# Patient Record
Sex: Female | Born: 1950 | Race: Black or African American | Hispanic: No
Health system: Southern US, Community
[De-identification: ages and names within clinical notes are randomized; demographics above are authoritative.]

---

## 2019-01-03 ENCOUNTER — Other Ambulatory Visit: Payer: Self-pay | Admitting: Physician Assistant

## 2019-01-03 DIAGNOSIS — Z1231 Encounter for screening mammogram for malignant neoplasm of breast: Secondary | ICD-10-CM

## 2019-02-06 ENCOUNTER — Other Ambulatory Visit: Payer: Self-pay | Admitting: Nephrology

## 2019-02-06 DIAGNOSIS — N184 Chronic kidney disease, stage 4 (severe): Secondary | ICD-10-CM

## 2019-02-12 ENCOUNTER — Ambulatory Visit
Admission: RE | Admit: 2019-02-12 | Discharge: 2019-02-12 | Disposition: A | Discharge status: Home / Self Care | Payer: Medicare Other | Source: Ambulatory Visit | Attending: Nephrology | Admitting: Nephrology

## 2019-02-12 DIAGNOSIS — N184 Chronic kidney disease, stage 4 (severe): Secondary | ICD-10-CM

## 2019-02-20 ENCOUNTER — Ambulatory Visit
Admission: RE | Admit: 2019-02-20 | Discharge: 2019-02-20 | Disposition: A | Discharge status: Home / Self Care | Payer: Medicare Other | Source: Ambulatory Visit | Attending: Physician Assistant | Admitting: Physician Assistant

## 2019-02-20 ENCOUNTER — Other Ambulatory Visit: Payer: Self-pay

## 2019-02-20 DIAGNOSIS — Z1231 Encounter for screening mammogram for malignant neoplasm of breast: Secondary | ICD-10-CM

## 2019-04-07 ENCOUNTER — Other Ambulatory Visit: Payer: Self-pay | Admitting: Physician Assistant

## 2019-04-07 ENCOUNTER — Other Ambulatory Visit (HOSPITAL_COMMUNITY): Payer: Self-pay | Admitting: Physician Assistant

## 2019-04-07 DIAGNOSIS — M5416 Radiculopathy, lumbar region: Secondary | ICD-10-CM

## 2019-04-21 ENCOUNTER — Encounter (HOSPITAL_COMMUNITY): Payer: Self-pay

## 2019-04-21 NOTE — Progress Notes (Signed)
Kelsey Lin,  Thanks. I had been trying the office. The radiologist recommended a CT myelogram due to the very limited images we could do with a MRI. We can only do a couple sequences and not a full exam due to the conditions of the stimulator. I let them know so they are going to do the CT instead for now. They will contact us if they decide they still want it after doing the CT myelogram.  Kelsey Lin RT (R ) (CT) (MR) ([place]) [place]   Radiology Services MRI Supervisor/MR Clinical Imaging Specialist Direct Dial: [phone] Website: [email or web address]   From: Kelsey Lin [email or web address]>  Sent: Tuesday, April 15, 2019 1:53 PM To: Kelsey Lin @[email or web address]> Subject: RE: PaceMaker  I thought I replied to this but I cannot find it, if I did. [facility] Secretary ext [phone] Other office number is [facility] [facility] Scheduler II (SW) NonNursing Sitter(SW)  [place] [phone]  From: Kelsey Lin [email or web address]>  Sent: Friday, April 11, 2019 1:50 PM To: Kelsey Lin @[email or web address]> Subject: RE: PaceMaker  Who from the office is trying to schedule this patient? I need to speak to them before we schedule this.  Kelsey Lin RT (R ) (CT) (MR) ([place]) [facility]   Radiology Services MRI Supervisor/MR Clinical Imaging Specialist Direct Dial: [phone] Website: [email or web address]   From: Kelsey Lin [email or web address]>  Sent: Friday, April 11, 2019 12:31 PM To: Kelsey Lin @ [email or web address]> Subject: PaceMaker  Kelsey Lin 17-Jan-1951 [id number] Patient brought her card in and it was scanned into epic   [place] Radiology Scheduler II (SW) NonNursing Sitter(SW)  [place] [phone]

## 2019-05-16 ENCOUNTER — Other Ambulatory Visit: Payer: Self-pay | Admitting: Physician Assistant

## 2019-05-16 DIAGNOSIS — M5416 Radiculopathy, lumbar region: Secondary | ICD-10-CM

## 2019-05-20 ENCOUNTER — Telehealth: Payer: Self-pay | Admitting: Nurse Practitioner

## 2019-05-20 NOTE — Telephone Encounter (Signed)
Phone call to patient to verify medication list and allergies for myelogram procedure. Pt aware she will not need to hold any medications for this procedure. Pre and post procedure instructions reviewed with pt. Pt verbalized understanding. 

## 2019-05-26 ENCOUNTER — Other Ambulatory Visit: Payer: Medicare Other

## 2019-06-15 ENCOUNTER — Ambulatory Visit: Payer: Medicare Other | Attending: Internal Medicine

## 2019-06-15 DIAGNOSIS — Z23 Encounter for immunization: Secondary | ICD-10-CM | POA: Insufficient documentation

## 2019-06-15 NOTE — Progress Notes (Signed)
   Covid-19 Vaccination Clinic  Name:  Syliva Rosson    MRN: [id number] DOB: December 08, 1950  06/15/2019  Ms. Jandt was observed post Covid-19 immunization for 15 minutes without incidence. She was provided with Vaccine Information Sheet and instruction to access the V-Safe system.   Ms. Stum was instructed to call 911 with any severe reactions post vaccine: Marland Kitchen Difficulty breathing  . Swelling of your face and throat  . A fast heartbeat  . A bad rash all over your body  . Dizziness and weakness    Immunizations Administered    Name Date Dose VIS Date Route   Pfizer COVID-19 Vaccine 06/15/2019  1:16 PM 0.3 mL 04/11/2019 Intramuscular   Manufacturer: [place]   Lot: [id number]   [place]: [id number]

## 2019-06-27 ENCOUNTER — Ambulatory Visit: Payer: Self-pay

## 2019-07-08 ENCOUNTER — Ambulatory Visit: Payer: Medicare Other | Attending: Internal Medicine

## 2019-07-08 DIAGNOSIS — Z23 Encounter for immunization: Secondary | ICD-10-CM

## 2019-07-08 NOTE — Progress Notes (Signed)
   Covid-19 Vaccination Clinic  Name:  Kelsey Lin    MRN: [id number] DOB: [birth year]  07/08/2019  [name] was observed post Covid-19 immunization for 15 minutes without incident. She was provided with Vaccine Information Sheet and instruction to access the V-Safe system.   [name] was instructed to call 911 with any severe reactions post vaccine: Marland Kitchen Difficulty breathing  . Swelling of face and throat  . A fast heartbeat  . A bad rash all over body  . Dizziness and weakness   Immunizations Administered    Name Date Dose VIS Date Route   Pfizer COVID-19 Vaccine 07/08/2019 10:19 AM 0.3 mL 04/11/2019 Intramuscular   Manufacturer: [place]   Lot: [id number]   [place]: [id number]

## 2019-07-09 ENCOUNTER — Ambulatory Visit: Payer: Medicare Other

## 2021-03-27 IMAGING — US US RENAL
1 series · 14 of 25 positions shown · non-contrast
Comparison: None

CLINICAL DATA: Chronic kidney disease

EXAM:
RENAL / URINARY TRACT ULTRASOUND COMPLETE

[Series 1: us renal · 0.19mm/px · 14 of 35 slices shown]
[im 1/35]
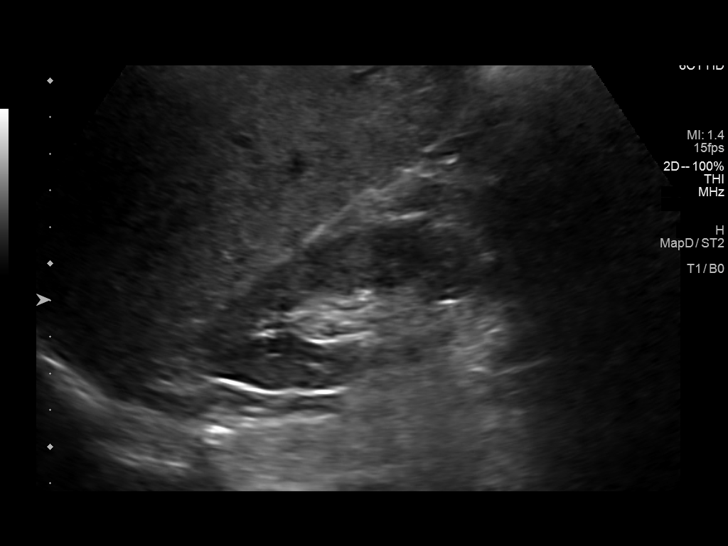
[im 3/35]
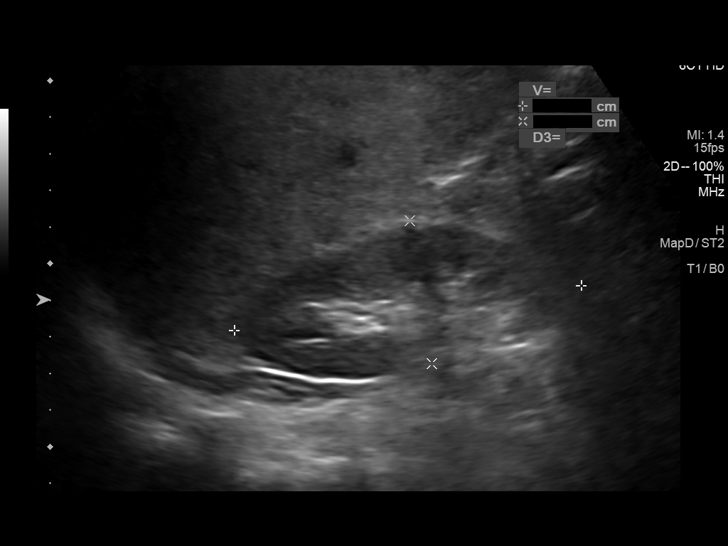
[im 6/35]
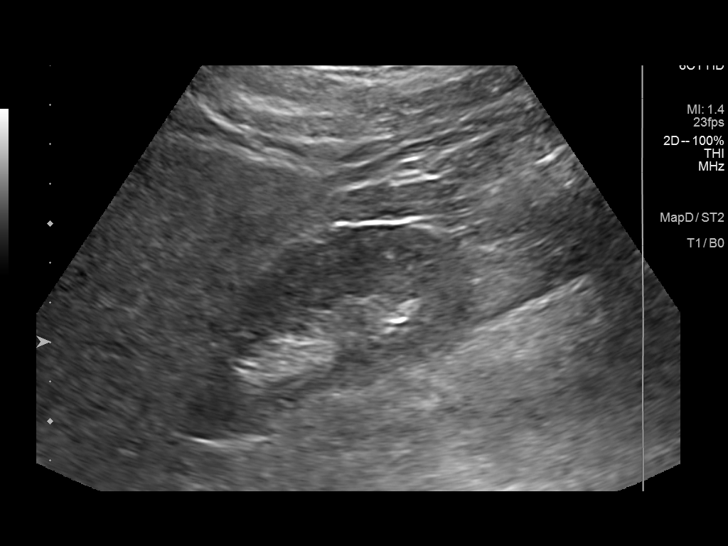
[im 9/35]
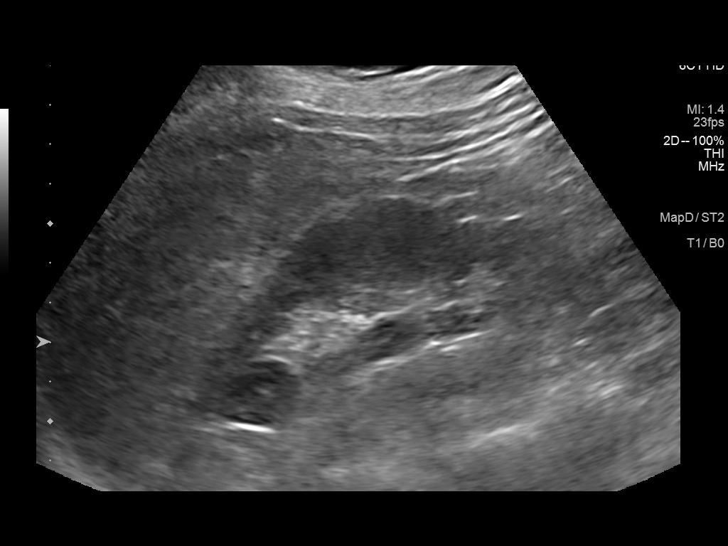
[im 12/35]
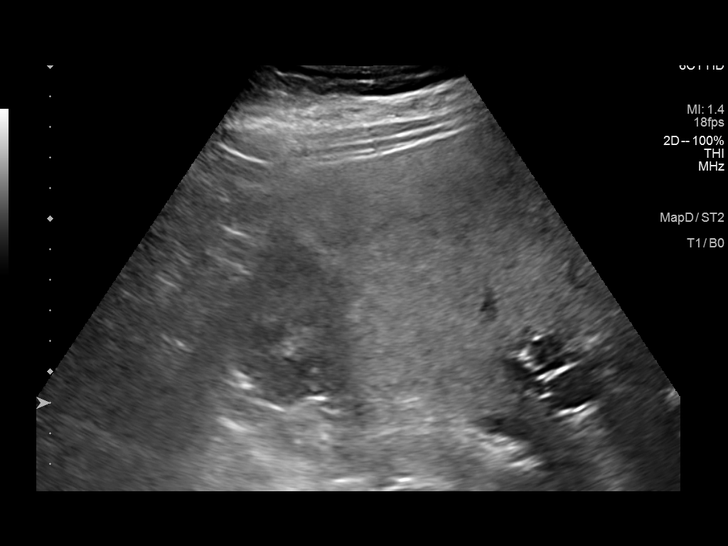
[im 13/35]
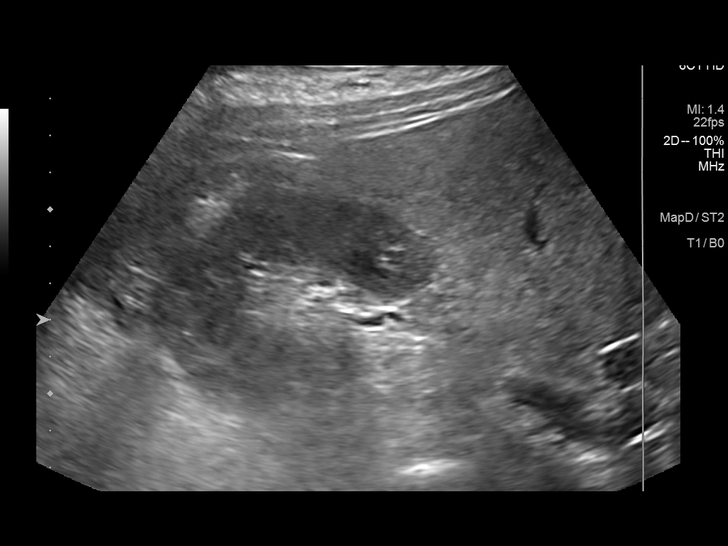
[im 16/35]
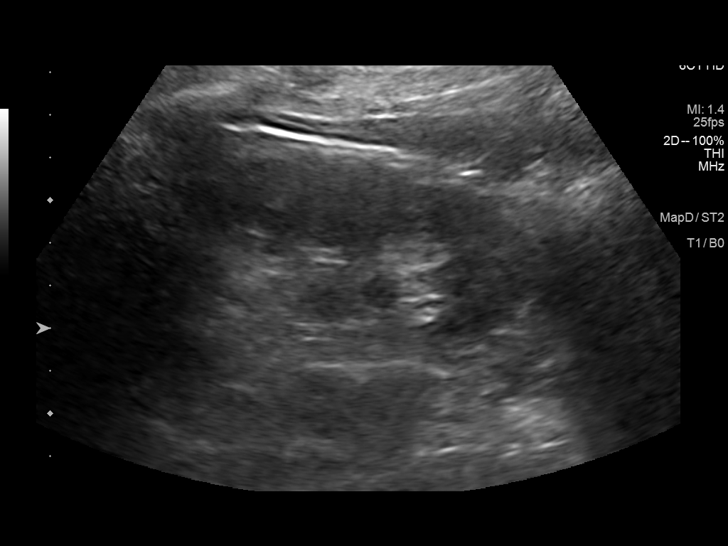
[im 19/35]
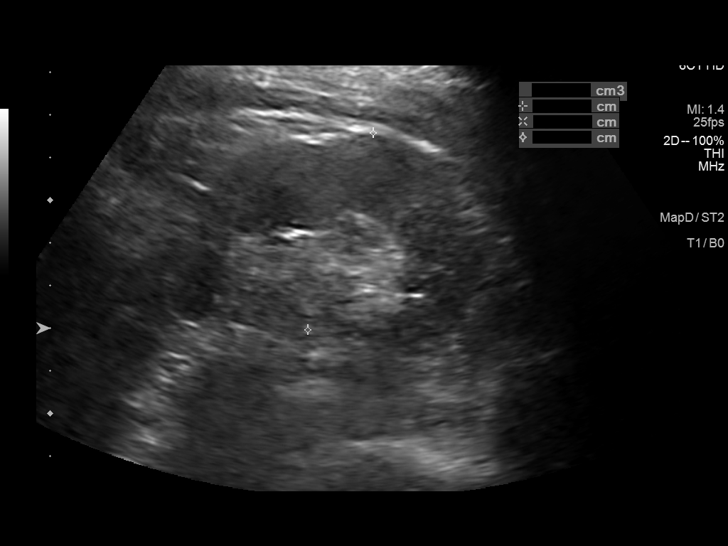
[im 22/35]
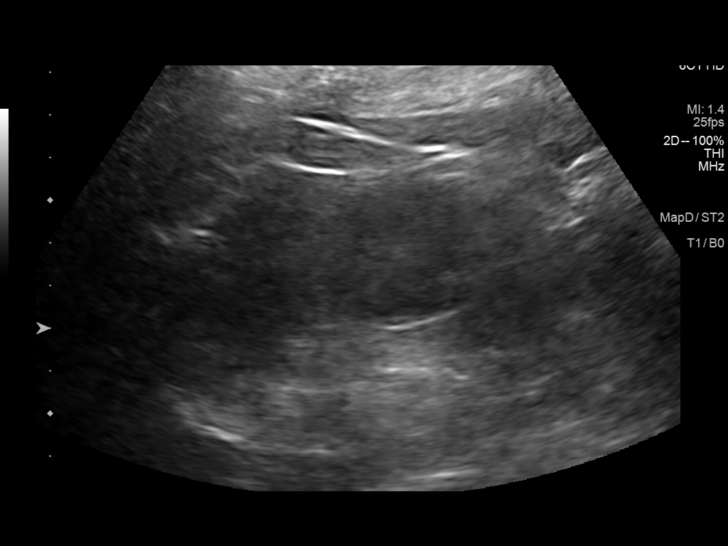
[im 23/35]
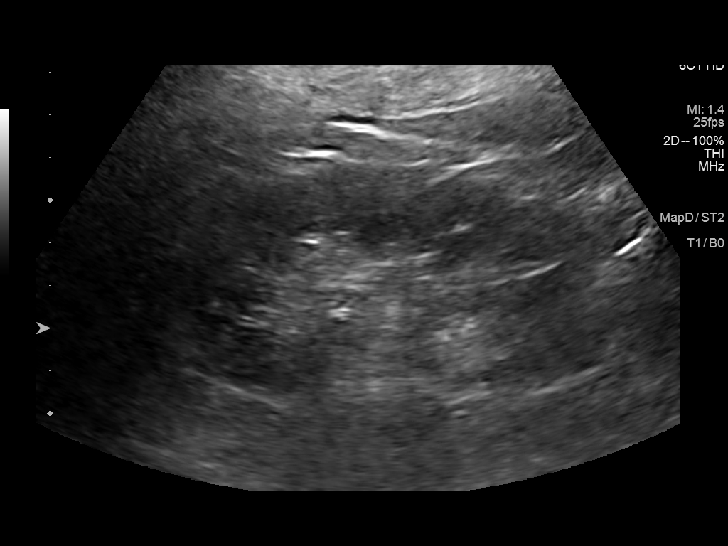
[im 26/35]
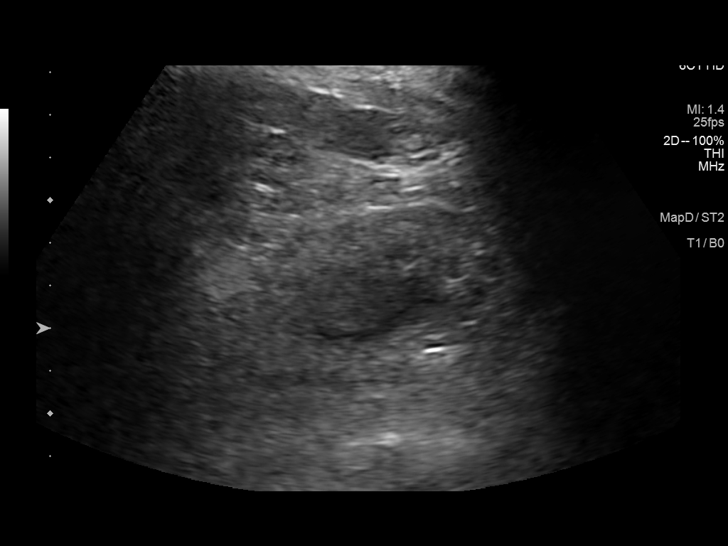
[im 29/35]
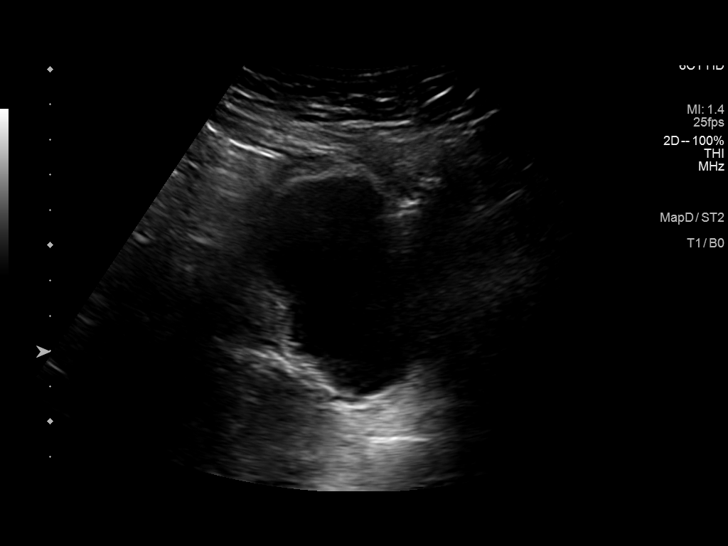
[im 32/35]
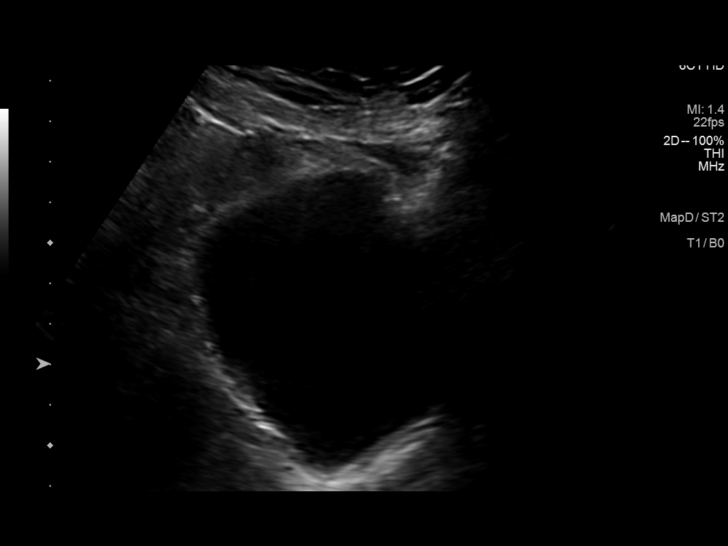
[im 35/35]
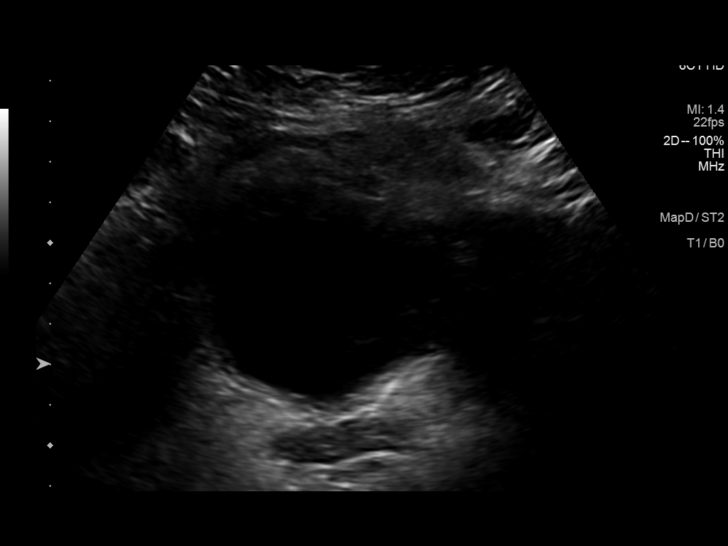

[14 of 25 positions shown; findings below may reference images not displayed]

FINDINGS: Right Kidney:

Renal measurements: 9.6 x 4.0 x 4.9 = volume: 97.3 mL . Echogenicity
within normal limits. No mass or hydronephrosis visualized.

Left Kidney:

Renal measurements: 9.9 x 4.8 x 4.9 = volume: 120 mL. Echogenicity
within normal limits. No mass or hydronephrosis visualized.

Bladder:

Appears normal for degree of bladder distention.

Other:

The right hepatic lobe incidentally imaged is more echogenic than
the adjacent kidney.
IMPRESSION: Normal renal sonogram.

Probable hepatic steatosis.

## 2021-04-04 IMAGING — MG DIGITAL SCREENING BILAT W/ TOMO W/ CAD
8 series · 8 of 24 positions shown · non-contrast
Comparison: Previous exam(s).

CLINICAL DATA: Screening.

EXAM:
DIGITAL SCREENING BILATERAL MAMMOGRAM WITH TOMO AND CAD

[R CC synth-2D]
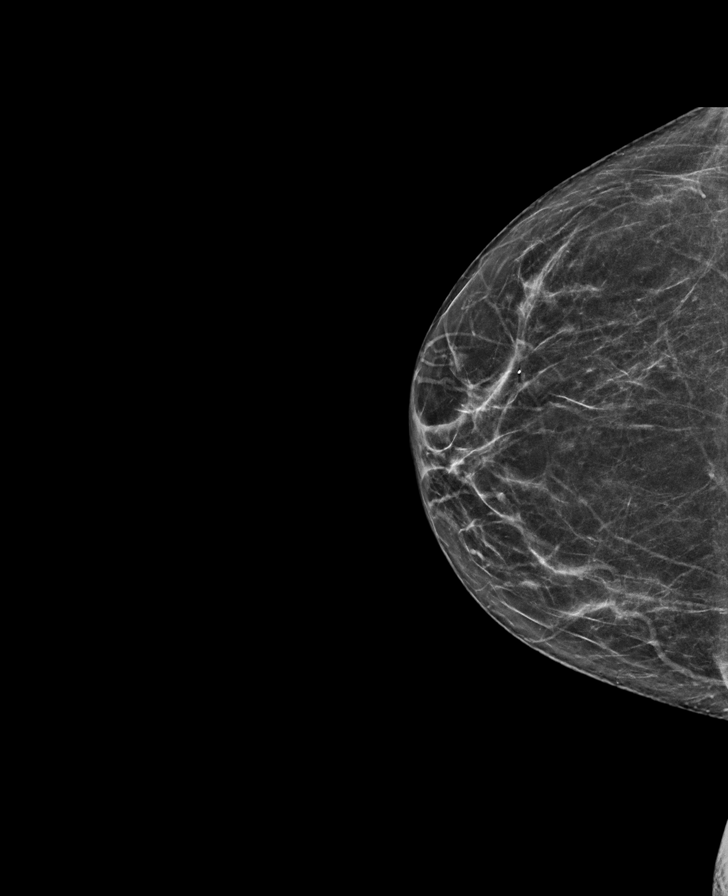

[L CC synth-2D]
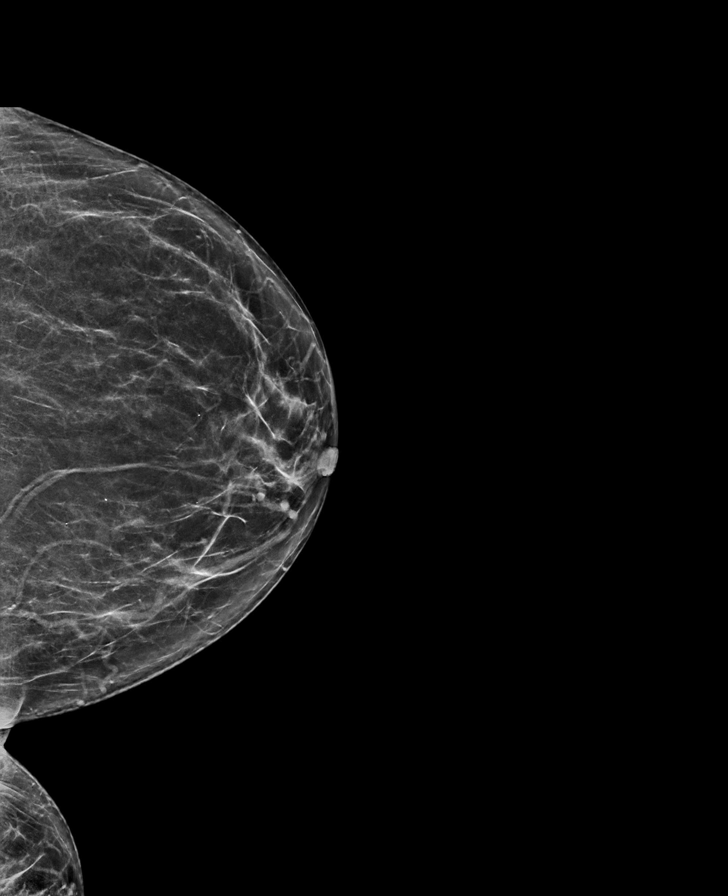

[R MLO synth-2D]
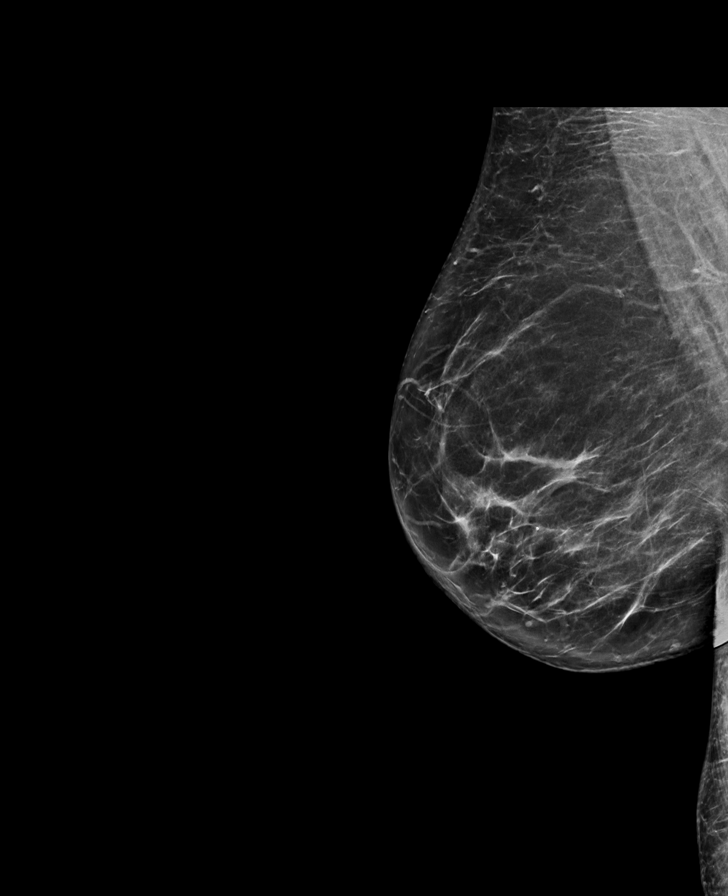

[L MLO synth-2D]
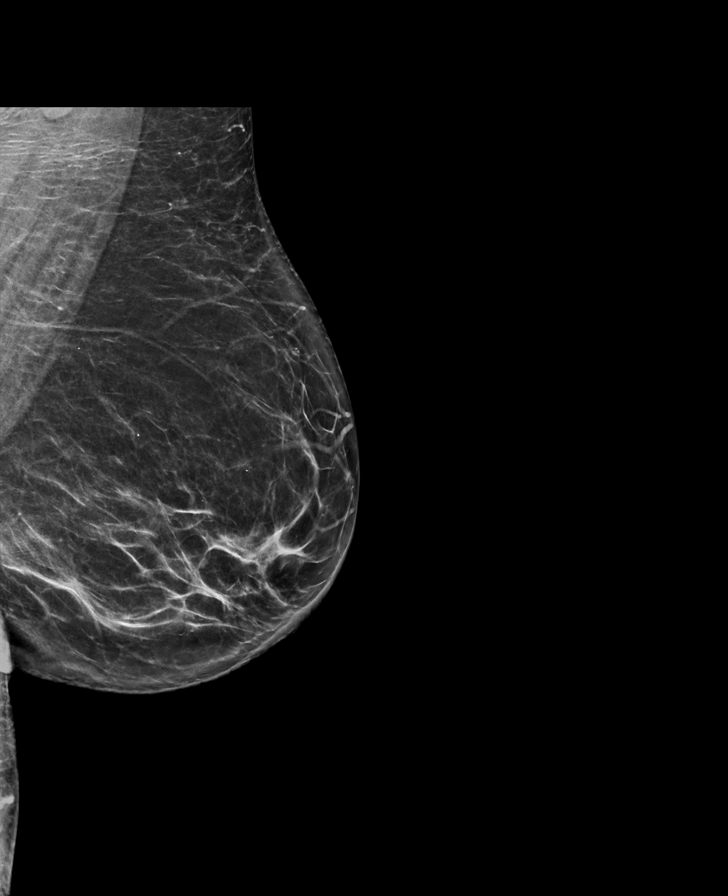

[L MLO tomo · tomo slice 39/77.0]
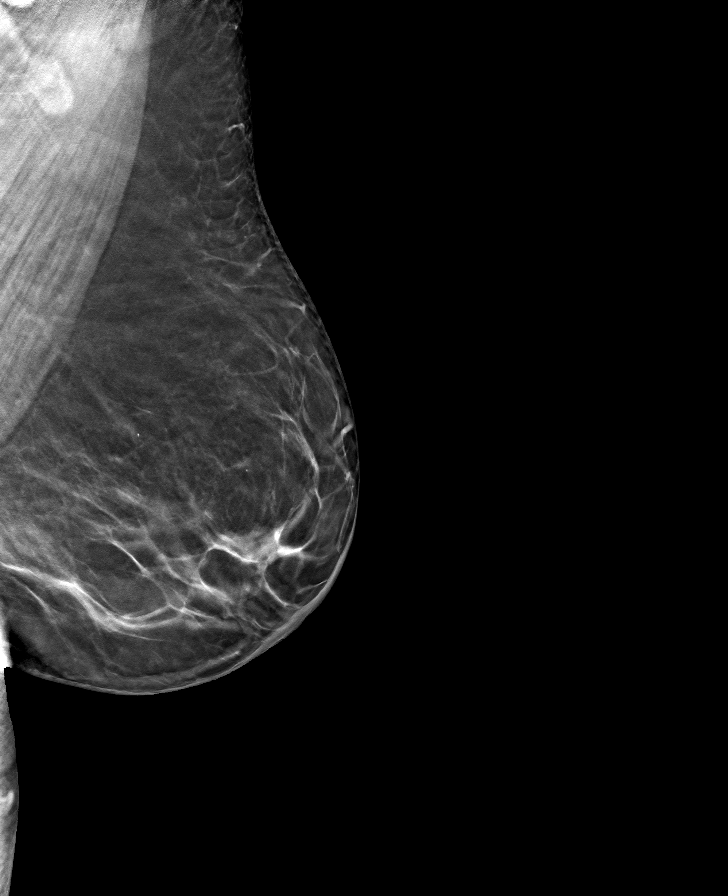

[L CC tomo · tomo slice 33/65.0]
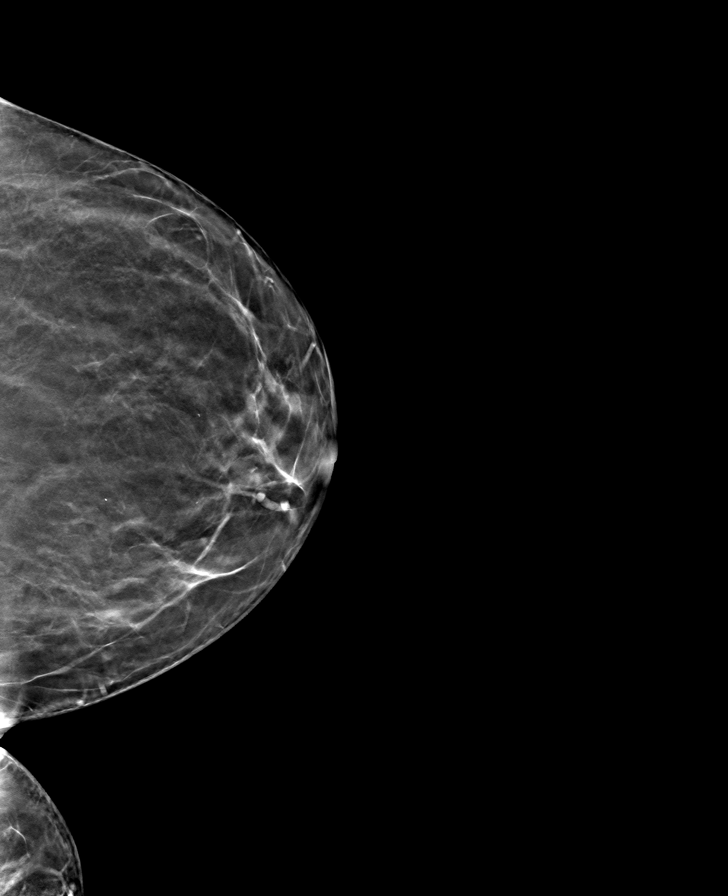

[R CC tomo · tomo slice 31/62.0]
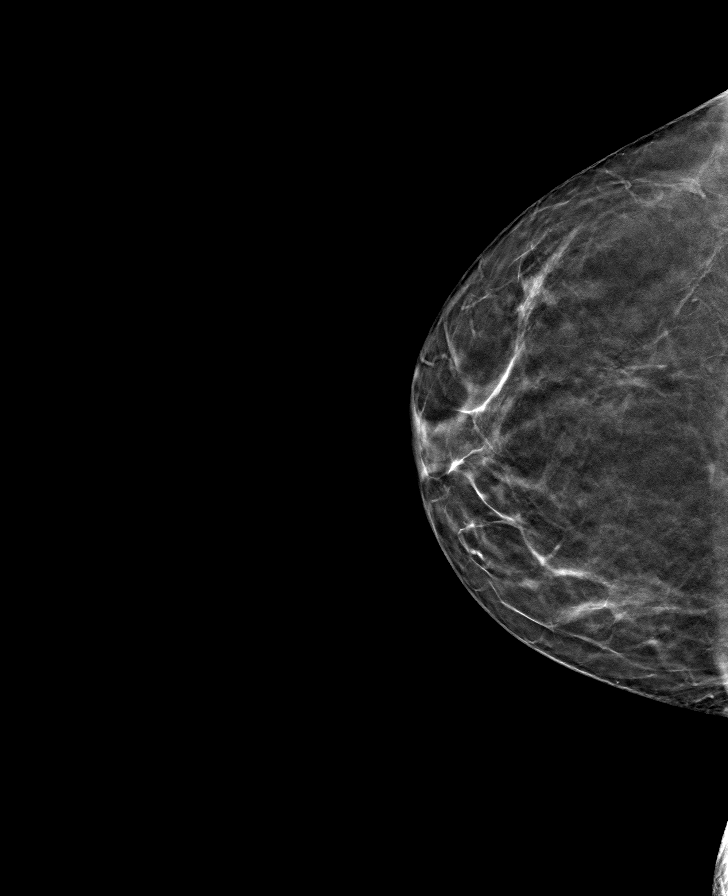

[R MLO tomo · tomo slice 39/76.0]
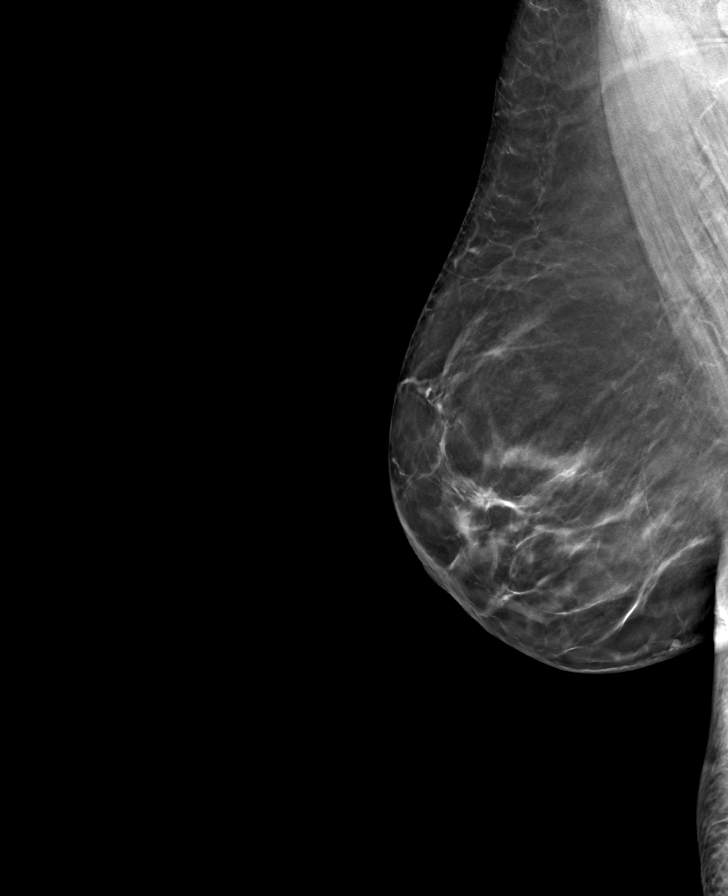

[8 of 24 positions shown; findings below may reference images not displayed]

ACR Breast Density Category b: There are scattered areas of
fibroglandular density.
FINDINGS: There are no findings suspicious for malignancy. Images were
processed with CAD.
IMPRESSION: No mammographic evidence of malignancy. A result letter of this
screening mammogram will be mailed directly to the patient.

RECOMMENDATION:
Screening mammogram in one year. (Code:[id number])

BI-RADS CATEGORY  1: Negative.
